# Patient Record
Sex: Male | Born: 1980 | Race: Black or African American | Hispanic: No | Marital: Single | Smoking: Current every day smoker
Health system: Southern US, Community
[De-identification: ages and names within clinical notes are randomized; demographics above are authoritative.]

## PROBLEM LIST (undated history)

## (undated) HISTORY — PX: MANDIBLE FRACTURE SURGERY: SHX706

---

## 2005-09-03 ENCOUNTER — Ambulatory Visit (HOSPITAL_COMMUNITY): Admission: RE | Admit: 2005-09-03 | Discharge: 2005-09-03 | Payer: Self-pay | Admitting: Family Medicine

## 2005-09-03 ENCOUNTER — Emergency Department (HOSPITAL_COMMUNITY): Admission: EM | Admit: 2005-09-03 | Discharge: 2005-09-03 | Payer: Self-pay | Admitting: Family Medicine

## 2005-09-04 ENCOUNTER — Observation Stay (HOSPITAL_COMMUNITY): Admission: EM | Admit: 2005-09-04 | Discharge: 2005-09-05 | Payer: Self-pay | Admitting: Emergency Medicine

## 2005-09-12 ENCOUNTER — Ambulatory Visit (HOSPITAL_COMMUNITY): Admission: RE | Admit: 2005-09-12 | Discharge: 2005-09-12 | Payer: Self-pay | Admitting: Otolaryngology

## 2005-09-16 ENCOUNTER — Ambulatory Visit (HOSPITAL_COMMUNITY): Admission: RE | Admit: 2005-09-16 | Discharge: 2005-09-16 | Payer: Self-pay | Admitting: Otolaryngology

## 2005-09-27 ENCOUNTER — Ambulatory Visit (HOSPITAL_COMMUNITY): Admission: RE | Admit: 2005-09-27 | Discharge: 2005-09-27 | Payer: Self-pay | Admitting: Otolaryngology

## 2005-10-01 ENCOUNTER — Ambulatory Visit (HOSPITAL_COMMUNITY): Admission: RE | Admit: 2005-10-01 | Discharge: 2005-10-01 | Payer: Self-pay | Admitting: Otolaryngology

## 2005-11-08 ENCOUNTER — Ambulatory Visit (HOSPITAL_COMMUNITY): Admission: RE | Admit: 2005-11-08 | Discharge: 2005-11-08 | Payer: Self-pay | Admitting: Otolaryngology

## 2007-07-12 IMAGING — DX DG ORTHOPANTOGRAM /PANORAMIC
1 series · 1 of 1 positions shown · non-contrast
Comparison: none

CLINICAL DATA: Mandible fracture.  Post-op evaluation.
PANOREX ? 1 VIEW:

[view not recorded]
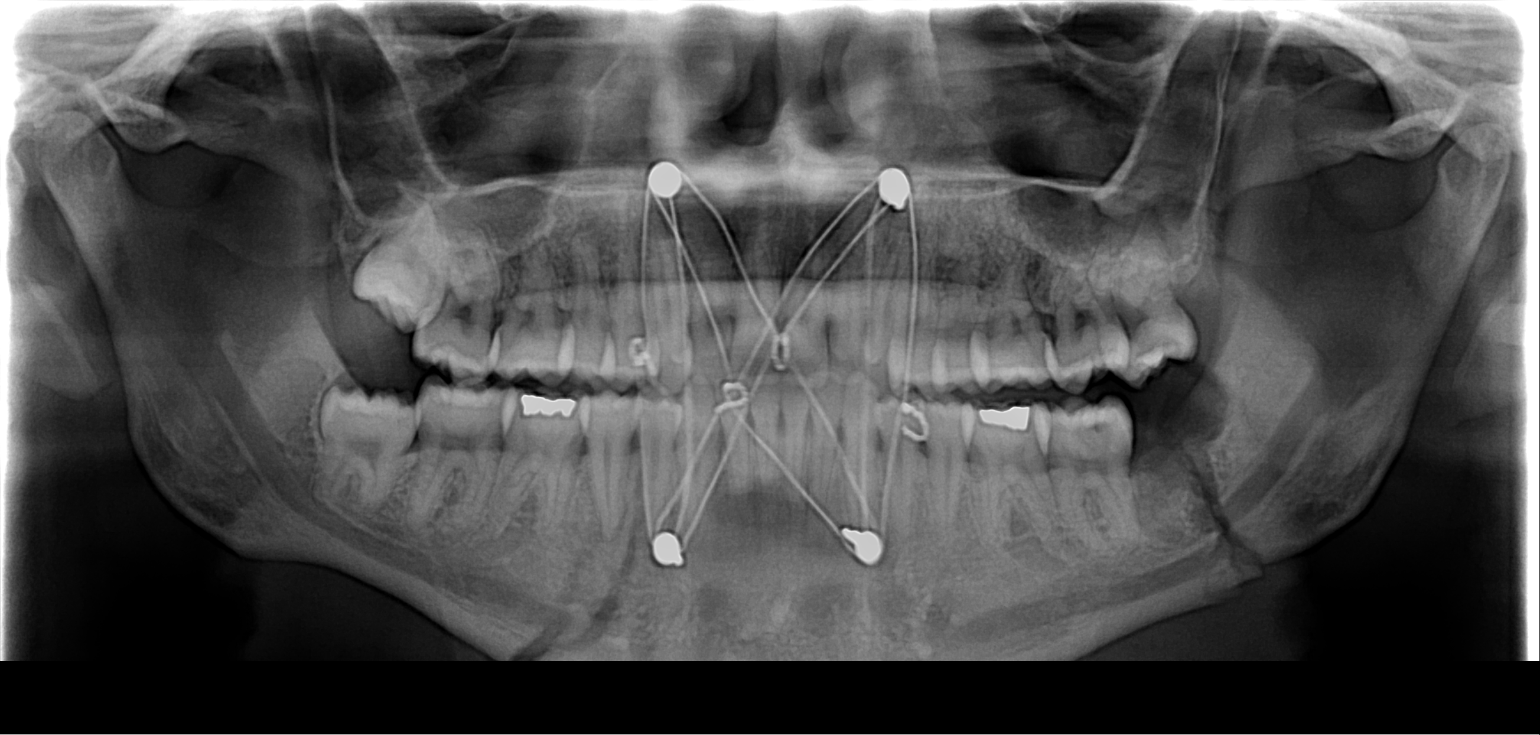

[1 of 1 positions shown; findings below may reference images not displayed]

FINDINGS: The patient has undergone a wiring procedure for stabilization of fracture of the right side of the mandible at the junction of the body and symphysis and the left side of the mandible at the junction of the body and the angle.  There are two maxillary and mandibular screws with wires in place for stabilization.  The patient has had extraction of teeth #16 and #17.  The mandibular canal on the left appears slightly offset by about the width of the canal.
IMPRESSION: Post-wiring procedure with full description as above.

## 2010-12-31 ENCOUNTER — Emergency Department (HOSPITAL_COMMUNITY)
Admission: EM | Admit: 2010-12-31 | Discharge: 2010-12-31 | Disposition: A | Discharge status: Home / Self Care | Payer: Self-pay | Attending: Emergency Medicine | Admitting: Emergency Medicine

## 2010-12-31 DIAGNOSIS — L723 Sebaceous cyst: Secondary | ICD-10-CM | POA: Insufficient documentation

## 2010-12-31 DIAGNOSIS — R221 Localized swelling, mass and lump, neck: Secondary | ICD-10-CM | POA: Insufficient documentation

## 2010-12-31 DIAGNOSIS — R220 Localized swelling, mass and lump, head: Secondary | ICD-10-CM | POA: Insufficient documentation

## 2011-04-09 ENCOUNTER — Emergency Department (HOSPITAL_COMMUNITY)
Admission: EM | Admit: 2011-04-09 | Discharge: 2011-04-09 | Disposition: A | Discharge status: Home / Self Care | Payer: Self-pay | Source: Home / Self Care

## 2011-04-09 ENCOUNTER — Encounter: Payer: Self-pay | Admitting: Emergency Medicine

## 2011-04-09 DIAGNOSIS — L723 Sebaceous cyst: Secondary | ICD-10-CM

## 2011-04-09 NOTE — ED Notes (Signed)
Large cyst to left side of face, in front of left ear lobe, feels fullness in ear lobe.  Has squeezed ear lobe with smelly drainage per patient.  Onset one year ago, but has escalated over the time.

## 2011-04-09 NOTE — ED Provider Notes (Signed)
History     CSN: [id number] Arrival date & time: 04/09/2011 11:23 AM   None     Chief Complaint  Patient presents with  . Cyst    (Consider location/radiation/quality/duration/timing/severity/associated sxs/prior treatment) HPI Comments: Pt presents with c/o a lump in front of his left ear. He states this lump has been present for over a year and is not changing in size. Sometimes in the mornings he is able to squeeze a foul smelling drainage from it. He states he was seen in the ED for this approx one month ago and was advised to follow up with [place] [facility]. Upon review of his last ED visit 12-31-10 pt stated the lump had been present for 3 years and he was advised to follow up with dermatology for removal. Pt states he did not schedule an appt with derm as advised. He has tried Amoxicillin which he had at home hoping it would help without improvement.   The history is provided by the patient.    History reviewed. No pertinent past medical history.  Past Surgical History  Procedure Date  . Mandible fracture surgery alleged assalt, hardware present from jaw surgery    History reviewed. No pertinent family history.  History  Substance Use Topics  . Smoking status: Current Everyday Smoker  . Smokeless tobacco: Not on file  . Alcohol Use: Yes      Review of Systems  Constitutional: Negative for fever and chills.  HENT: Negative for ear pain, congestion and ear discharge.   Respiratory: Positive for cough. Negative for shortness of breath.     Allergies  Review of patient's allergies indicates no known allergies.  Home Medications  No current outpatient prescriptions on file.  BP 112/60  Pulse 88  Temp(Src) 98.2 F (36.8 C) (Oral)  Resp 18  SpO2 100%  Physical Exam  Nursing note and vitals reviewed. Constitutional: He appears well-developed and well-nourished. No distress.  HENT:  Head: Normocephalic and atraumatic.  Right Ear: Tympanic membrane, external  ear and ear canal normal.  Left Ear: Tympanic membrane, external ear and ear canal normal.  Nose: Nose normal.  Mouth/Throat: Uvula is midline, oropharynx is clear and moist and mucous membranes are normal. No oropharyngeal exudate, posterior oropharyngeal edema or posterior oropharyngeal erythema.  Neck: Neck supple.  Cardiovascular: Normal rate, regular rhythm and normal heart sounds.   Pulmonary/Chest: Effort normal and breath sounds normal. No respiratory distress.  Lymphadenopathy:       Head (right side): No submandibular, no tonsillar, no preauricular, no posterior auricular and no occipital adenopathy present.       Head (left side): No submandibular, no tonsillar, no preauricular, no posterior auricular and no occipital adenopathy present.    He has no cervical adenopathy.  Neurological: He is alert.  Skin: Skin is warm and dry.       Anterior to Lt ear 3 cm smooth mobile superficial nodule, typical feel of sebacous cyst. No obvious pore noted. No erythema, and nontender.   Psychiatric: He has a normal mood and affect.    ED Course  Procedures (including critical care time)  Labs Reviewed - No data to display No results found.   1. Sebaceous cyst       MDM  Discussed with pt proper mgmt is excision of cyst, not I&D. Referred pt again to dermatology.         Melody Comas, PA 04/09/11 1210  Melody Comas, [place] 04/09/11 [phone]

## 2011-04-10 NOTE — ED Provider Notes (Signed)
Medical screening examination/treatment/procedure(s) were performed by non-physician practitioner and as supervising physician I was immediately available for consultation/collaboration.   [facility]; MD   Sharin Grave, MD 04/10/11 1012

## 2013-10-21 ENCOUNTER — Encounter (HOSPITAL_COMMUNITY): Payer: Self-pay | Admitting: Emergency Medicine

## 2013-10-21 ENCOUNTER — Emergency Department (INDEPENDENT_AMBULATORY_CARE_PROVIDER_SITE_OTHER)
Admission: EM | Admit: 2013-10-21 | Discharge: 2013-10-21 | Disposition: A | Discharge status: Home / Self Care | Payer: Self-pay | Source: Home / Self Care

## 2013-10-21 DIAGNOSIS — W57XXXA Bitten or stung by nonvenomous insect and other nonvenomous arthropods, initial encounter: Secondary | ICD-10-CM

## 2013-10-21 DIAGNOSIS — T148 Other injury of unspecified body region: Secondary | ICD-10-CM

## 2013-10-21 MED ORDER — CEPHALEXIN 500 MG PO CAPS: 500.0000 mg | ORAL_CAPSULE | Freq: Four times a day (QID) | ORAL | Status: AC

## 2013-10-21 NOTE — Discharge Instructions (Signed)

## 2013-10-21 NOTE — ED Notes (Addendum)
C/o insect bite under eye which was noticed two weeks ago States has seen greenish yellowish pus coming from area States area started off small Hot compresses and home remedies was used as tx

## 2013-10-21 NOTE — ED Provider Notes (Signed)
CSN: [id number]     Arrival date & time 10/21/13  0906 History   First MD Initiated Contact with Patient 10/21/13 [phone]     Chief Complaint  Patient presents with  . Insect Bite   (Consider location/radiation/quality/duration/timing/severity/associated sxs/prior Treatment) HPI Comments: Pt knocked an insect off of his face a few days ago. Afterwards felt tenderness beneath the OD, distal to the lower lid. Swelling area increased to involve the lower lid for brief time. Has been applying warm compresses and now decreased in size to an ovoid vesicle. Denies systemic sx's.   History reviewed. No pertinent past medical history. Past Surgical History  Procedure Laterality Date  . Mandible fracture surgery  alleged assalt, hardware present from jaw surgery   History reviewed. No pertinent family history. History  Substance Use Topics  . Smoking status: Current Every Day Smoker  . Smokeless tobacco: Not on file  . Alcohol Use: Yes    Review of Systems  Constitutional: Negative for fever, activity change and appetite change.  HENT: Negative.   Respiratory: Negative.   Skin:       As per history of present illness  All other systems reviewed and are negative.   Allergies  Review of patient's allergies indicates no known allergies.  Home Medications   Prior to Admission medications   Not on File   There were no vitals taken for this visit. Physical Exam  Nursing note and vitals reviewed. Constitutional: He is oriented to person, place, and time. He appears well-developed and well-nourished. No distress.  HENT:  Mouth/Throat: Oropharynx is clear and moist.  1 point centimeter ovoid vesicular lesion located beneath the right eyelid. It is not extending into the eyelid. It is well marginated. No surrounding erythema or induration. No lymphangitis. The right is unaffected. It is soft but not currently draining.  Eyes: Conjunctivae and EOM are normal. Pupils are equal, round, and  reactive to light.  Neck: Normal range of motion. Neck supple.  Pulmonary/Chest: Effort normal. No respiratory distress.  Lymphadenopathy:    He has no cervical adenopathy.  Neurological: He is alert and oriented to person, place, and time.  Skin: Skin is warm and dry.    ED Course  Procedures (including critical care time) Labs Review Labs Reviewed - No data to display  Imaging Review No results found.   MDM  No diagnosis found. Warm compresses Keflex rechk for problems.   Hayden Rasmussenavid Evaristo Tsuda, NP 10/21/13 [phone]

## 2013-10-22 NOTE — ED Provider Notes (Signed)
Medical screening examination/treatment/procedure(s) were performed by non-physician practitioner and as supervising physician I was immediately available for consultation/collaboration.  Jordell Outten, M.D.  Teniola Tseng C Saramarie Stinger, MD 10/22/13 1748 

## 2016-10-24 DIAGNOSIS — Y929 Unspecified place or not applicable: Secondary | ICD-10-CM | POA: Insufficient documentation

## 2016-10-24 DIAGNOSIS — Y999 Unspecified external cause status: Secondary | ICD-10-CM | POA: Insufficient documentation

## 2016-10-24 DIAGNOSIS — Y939 Activity, unspecified: Secondary | ICD-10-CM | POA: Insufficient documentation

## 2016-10-24 DIAGNOSIS — Z5321 Procedure and treatment not carried out due to patient leaving prior to being seen by health care provider: Secondary | ICD-10-CM | POA: Insufficient documentation

## 2016-10-24 DIAGNOSIS — W57XXXA Bitten or stung by nonvenomous insect and other nonvenomous arthropods, initial encounter: Secondary | ICD-10-CM | POA: Insufficient documentation

## 2016-10-25 ENCOUNTER — Emergency Department (HOSPITAL_COMMUNITY)
Admission: EM | Admit: 2016-10-25 | Discharge: 2016-10-25 | Disposition: A | Discharge status: Home / Self Care | Payer: Self-pay | Attending: Emergency Medicine | Admitting: Emergency Medicine

## 2016-10-25 ENCOUNTER — Encounter (HOSPITAL_COMMUNITY): Payer: Self-pay | Admitting: Emergency Medicine

## 2016-10-25 NOTE — ED Triage Notes (Signed)
Patient reports insect bite at left distal forearm with itching this evening .

## 2018-02-19 ENCOUNTER — Encounter (HOSPITAL_COMMUNITY): Payer: Self-pay

## 2018-02-19 ENCOUNTER — Other Ambulatory Visit: Payer: Self-pay

## 2018-02-19 ENCOUNTER — Ambulatory Visit (HOSPITAL_COMMUNITY)
Admission: EM | Admit: 2018-02-19 | Discharge: 2018-02-19 | Disposition: A | Discharge status: Home / Self Care | Payer: Self-pay | Attending: Family Medicine | Admitting: Family Medicine

## 2018-02-19 DIAGNOSIS — J020 Streptococcal pharyngitis: Secondary | ICD-10-CM

## 2018-02-19 LAB — POCT RAPID STREP A: Streptococcus, Group A Screen (Direct): POSITIVE — AB

## 2018-02-19 MED ORDER — CETIRIZINE HCL 10 MG PO CAPS: 10.0000 mg | ORAL_CAPSULE | Freq: Every day | ORAL | 0 refills | Status: AC

## 2018-02-19 MED ORDER — AMOXICILLIN-POT CLAVULANATE 875-125 MG PO TABS: 1.0000 | ORAL_TABLET | Freq: Two times a day (BID) | ORAL | 0 refills | Status: AC

## 2018-02-19 MED ORDER — PSEUDOEPH-BROMPHEN-DM 30-2-10 MG/5ML PO SYRP: 5.0000 mL | ORAL_SOLUTION | Freq: Four times a day (QID) | ORAL | 0 refills | Status: AC | PRN

## 2018-02-19 NOTE — ED Triage Notes (Signed)
Pt c/o sore throat x 1 week

## 2018-02-19 NOTE — Discharge Instructions (Signed)

## 2018-02-19 NOTE — ED Provider Notes (Signed)
[facility]    CSN: [id number] Arrival date & time: 02/19/18  1427     History   Chief Complaint Chief Complaint  Patient presents with  . Sore Throat    HPI Carl Wang is a 37 y.o. male no significant past medical history presenting today for evaluation of sore throat.  He states that he has had a sore throat for approximately 1 week.  Is also noticed a significant amount of mucus production.  He is having a productive cough.  Slight decrease in appetite due to discomfort with swallowing.  Mild headache.  Has tried Robitussin and Advil without relief.  Denies fevers.  Denies known exposures to strep.  Patient smokes approximately 7 cigarettes a day.  HPI  History reviewed. No pertinent past medical history.  There are no active problems to display for this patient.   Past Surgical History:  Procedure Laterality Date  . MANDIBLE FRACTURE SURGERY  alleged assalt, hardware present from jaw surgery       Home Medications    Prior to Admission medications   Medication Sig Start Date End Date Taking? Authorizing Provider  amoxicillin-clavulanate (AUGMENTIN) 875-125 MG tablet Take 1 tablet by mouth every 12 (twelve) hours for 10 days. 02/19/18 03/01/18  Wieters, Hallie C, PA-C  brompheniramine-pseudoephedrine-DM 30-2-10 MG/5ML syrup Take 5 mLs by mouth 4 (four) times daily as needed. 02/19/18   Wieters, Hallie C, PA-C  cephALEXin (KEFLEX) 500 MG capsule Take 1 capsule (500 mg total) by mouth 4 (four) times daily. 10/21/13   Hayden Rasmussen, NP  Cetirizine HCl 10 MG CAPS Take 1 capsule (10 mg total) by mouth daily for 10 days. 02/19/18 03/01/18  Wieters, Junius Creamer, PA-C    Family History History reviewed. No pertinent family history.  Social History Social History   Tobacco Use  . Smoking status: Current Every Day Smoker  . Smokeless tobacco: Never Used  Substance Use Topics  . Alcohol use: Yes  . Drug use: No     Allergies   Patient has no known  allergies.   Review of Systems Review of Systems  Constitutional: Negative for activity change, appetite change, chills, fatigue and fever.  HENT: Positive for congestion and sore throat. Negative for ear pain, rhinorrhea, sinus pressure and trouble swallowing.   Eyes: Negative for discharge and redness.  Respiratory: Positive for cough. Negative for chest tightness and shortness of breath.   Cardiovascular: Negative for chest pain.  Gastrointestinal: Negative for abdominal pain, diarrhea, nausea and vomiting.  Musculoskeletal: Negative for myalgias.  Skin: Negative for rash.  Neurological: Negative for dizziness, light-headedness and headaches.     Physical Exam Triage Vital Signs ED Triage Vitals  Enc Vitals Group     BP 02/19/18 1447 138/78     Pulse Rate 02/19/18 1447 100     Resp 02/19/18 1447 18     Temp 02/19/18 1447 99.4 F (37.4 C)     Temp Source 02/19/18 1447 Oral     SpO2 02/19/18 1447 100 %     Weight 02/19/18 1448 200 lb (90.7 kg)     Height --      Head Circumference --      Peak Flow --      Pain Score 02/19/18 1449 6     Pain Loc --      Pain Edu? --      Excl. in [place]? --    No data found.  Updated Vital Signs BP 138/78 (BP Location: Right Arm)  Pulse 100   Temp 99.4 F (37.4 C) (Oral)   Resp 18   Wt 200 lb (90.7 kg)   SpO2 100%   Visual Acuity Right Eye Distance:   Left Eye Distance:   Bilateral Distance:    Right Eye Near:   Left Eye Near:    Bilateral Near:     Physical Exam  Constitutional: He appears well-developed and well-nourished.  HENT:  Head: Normocephalic and atraumatic.  Bilateral ears without tenderness to palpation of external auricle, tragus and mastoid, EAC's without erythema or swelling, TM's with good bony landmarks and cone of light. Non erythematous.  Oral mucosa pink and moist, bilateral tonsils mildly enlarged and erythematous, small amount of exudate, uvula slightly swollen, no deviation.  Posterior pharynx  erythematous. Normal phonation.   Eyes: Conjunctivae are normal.  Neck: Neck supple.  Cardiovascular: Normal rate and regular rhythm.  No murmur heard. Pulmonary/Chest: Effort normal and breath sounds normal. No respiratory distress.  Breathing comfortably at rest, CTABL, no wheezing, rales or other adventitious sounds auscultated  Abdominal: Soft. There is no tenderness.  Musculoskeletal: He exhibits no edema.  Neurological: He is alert.  Skin: Skin is warm and dry.  Psychiatric: He has a normal mood and affect.  Nursing note and vitals reviewed.    UC Treatments / Results  Labs (all labs ordered are listed, but only abnormal results are displayed) Labs Reviewed - No data to display  EKG None  Radiology No results found.  Procedures Procedures (including critical care time)  Medications Ordered in UC Medications - No data to display  Initial Impression / Assessment and Plan / UC Course  I have reviewed the triage vital signs and the nursing notes.  Pertinent labs & imaging results that were available during my care of the patient were reviewed by me and considered in my medical decision making (see chart for details).     Patient tested positive for strep. No evidence of peritonsillar abscess or retropharyngeal abscess. Patient is nontoxic appearing, no drooling, dysphagia, muffled voice, or tripoding. No trismus.  Augmentin provided.  Also provided Zyrtec and cough syrup to help with drainage and congestion/cough.Discussed strict return precautions. Patient verbalized understanding and is agreeable with plan.   Final Clinical Impressions(s) / UC Diagnoses   Final diagnoses:  Strep throat     Discharge Instructions     Sore Throat  Your rapid strep test was positive today. We will treat you for strep throat with an antibiotic. Please take Amoxicillin as prescribed.   Please continue Tylenol or Ibuprofen for fever and pain. May try salt water gargles, cepacol  lozenges, throat spray, or OTC cold relief medicine for throat discomfort. If you also have congestion take a daily anti-histamine like Zyrtec, Claritin, and a oral decongestant to help with post nasal drip that may be irritating your throat.   Stay hydrated and drink plenty of fluids to keep your throat coated relieve irritation.     ED Prescriptions    Medication Sig Dispense Auth. Provider   amoxicillin-clavulanate (AUGMENTIN) 875-125 MG tablet Take 1 tablet by mouth every 12 (twelve) hours for 10 days. 20 tablet Wieters, Hallie C, PA-C   Cetirizine HCl 10 MG CAPS Take 1 capsule (10 mg total) by mouth daily for 10 days. 10 capsule Wieters, Hallie C, PA-C   brompheniramine-pseudoephedrine-DM 30-2-10 MG/5ML syrup Take 5 mLs by mouth 4 (four) times daily as needed. 120 mL Wieters, [place] C, PA-C     Controlled Substance Prescriptions [place] Controlled  Substance Registry consulted? Not Applicable   Lew Dawes, [place] 02/19/18 1519

## 2018-12-20 ENCOUNTER — Other Ambulatory Visit: Payer: Self-pay

## 2018-12-20 ENCOUNTER — Encounter (HOSPITAL_COMMUNITY): Payer: Self-pay | Admitting: Emergency Medicine

## 2018-12-20 ENCOUNTER — Ambulatory Visit (INDEPENDENT_AMBULATORY_CARE_PROVIDER_SITE_OTHER): Payer: Self-pay

## 2018-12-20 ENCOUNTER — Ambulatory Visit (HOSPITAL_COMMUNITY)
Admission: EM | Admit: 2018-12-20 | Discharge: 2018-12-20 | Disposition: A | Discharge status: Home / Self Care | Payer: Self-pay | Attending: Family Medicine | Admitting: Family Medicine

## 2018-12-20 DIAGNOSIS — R1084 Generalized abdominal pain: Secondary | ICD-10-CM

## 2018-12-20 LAB — POCT URINALYSIS DIP (DEVICE)
Bilirubin Urine: NEGATIVE
Glucose, UA: NEGATIVE mg/dL
Hgb urine dipstick: NEGATIVE
Ketones, ur: NEGATIVE mg/dL
Leukocytes,Ua: NEGATIVE
Nitrite: NEGATIVE
Protein, ur: NEGATIVE mg/dL
Specific Gravity, Urine: 1.01 (ref 1.005–1.030)
Urobilinogen, UA: 0.2 mg/dL (ref 0.0–1.0)
pH: 6 (ref 5.0–8.0)

## 2018-12-20 NOTE — ED Triage Notes (Signed)
Pt sts nausea and upset stomach since eating some possibly bad food on Monday

## 2018-12-20 NOTE — ED Provider Notes (Signed)
[name]    CSN: [id number] Arrival date & time: 12/20/18  1415      History   Chief Complaint Chief Complaint  Patient presents with  . Nausea    HPI Carl Wang is a 38 y.o. male.   Patient is a 38 year old male that is otherwise healthy presenting today for nausea, generalized abdominal cramping that has been waxing and waning since Monday.  Reporting that he has not had a good bowel movement since Monday and he typically goes to the bathroom every morning.  This started after eating some possible "bad meat".  No vomiting or diarrhea.  No rectal bleeding.  He has not taken any medication for his symptoms.  Denies any fevers or recent traveling.  ROS per HPI      History reviewed. No pertinent past medical history.  There are no active problems to display for this patient.   Past Surgical History:  Procedure Laterality Date  . MANDIBLE FRACTURE SURGERY  alleged assalt, hardware present from jaw surgery       Home Medications    Prior to Admission medications   Medication Sig Start Date End Date Taking? Authorizing Provider  brompheniramine-pseudoephedrine-DM 30-2-10 MG/5ML syrup Take 5 mLs by mouth 4 (four) times daily as needed. 02/19/18   Wieters, Hallie C, PA-C  cephALEXin (KEFLEX) 500 MG capsule Take 1 capsule (500 mg total) by mouth 4 (four) times daily. Patient not taking: Reported on 12/20/2018 10/21/13   Janne Napoleon, NP  Cetirizine HCl 10 MG CAPS Take 1 capsule (10 mg total) by mouth daily for 10 days. 02/19/18 03/01/18  Wieters, Elesa Hacker, PA-C    Family History History reviewed. No pertinent family history.  Social History Social History   Tobacco Use  . Smoking status: Current Every Day Smoker  . Smokeless tobacco: Never Used  Substance Use Topics  . Alcohol use: Yes  . Drug use: No     Allergies   Patient has no known allergies.   Review of Systems Review of Systems   Physical Exam Triage Vital Signs ED Triage Vitals   Enc Vitals Group     BP 12/20/18 1449 136/83     Pulse Rate 12/20/18 1449 73     Resp 12/20/18 1449 18     Temp 12/20/18 1449 98 F (36.7 C)     Temp Source 12/20/18 1449 Oral     SpO2 12/20/18 1449 99 %     Weight --      Height --      Head Circumference --      Peak Flow --      Pain Score 12/20/18 1450 2     Pain Loc --      Pain Edu? --      Excl. in [place]? --    No data found.  Updated Vital Signs BP 136/83 (BP Location: Right Arm)   Pulse 73   Temp 98 F (36.7 C) (Oral)   Resp 18   SpO2 99%   Visual Acuity Right Eye Distance:   Left Eye Distance:   Bilateral Distance:    Right Eye Near:   Left Eye Near:    Bilateral Near:     Physical Exam Vitals signs and nursing note reviewed.  Constitutional:      Appearance: Normal appearance.  HENT:     Head: Normocephalic and atraumatic.     Nose: Nose normal.  Eyes:     Conjunctiva/sclera: Conjunctivae normal.  Neck:  Musculoskeletal: Normal range of motion.  Pulmonary:     Effort: Pulmonary effort is normal.  Abdominal:     General: There is no distension.     Palpations: Abdomen is soft.     Tenderness: There is no abdominal tenderness. There is no right CVA tenderness, left CVA tenderness, guarding or rebound.     Comments: Generalized abdominal discomfort More in the right upper quadrant and epigastric area.  Musculoskeletal: Normal range of motion.  Skin:    General: Skin is warm and dry.  Neurological:     Mental Status: He is alert.  Psychiatric:        Mood and Affect: Mood normal.      UC Treatments / Results  Labs (all labs ordered are listed, but only abnormal results are displayed) Labs Reviewed  POCT URINALYSIS DIP (DEVICE)    EKG   Radiology Dg Abd 1 View  Result Date: 12/20/2018 CLINICAL DATA:  Abdominal pain rule out constipation EXAM: ABDOMEN - 1 VIEW COMPARISON:  None. FINDINGS: Mild amount of stool in the hepatic flexure of the colon. Normal bowel gas pattern without  obstruction or ileus. No abnormal calcifications. IMPRESSION: Mild amount of stool in the [facility] flexure. Electronically Signed   By: Marlan Palauharles  Clark M.D.   On: 12/20/2018 15:59    Procedures Procedures (including critical care time)  Medications Ordered in UC Medications - No data to display  Initial Impression / Assessment and Plan / UC Course  I have reviewed the triage vital signs and the nursing notes.  Pertinent labs & imaging results that were available during my care of the patient were reviewed by me and considered in my medical decision making (see chart for details).     Symptoms most likely due to constipation or some sort of viral illness. No concern for obstruction We will have him try a small dose of MiraLAX, increase water and fruits and veggies see if this helps. Otherwise if the symptoms worsen to include more severe abdominal pain, severe nausea or vomiting he will need to go the ER. Patient understanding and agree. Urine negative for any infection Final Clinical Impressions(s) / UC Diagnoses   Final diagnoses:  Generalized abdominal pain     Discharge Instructions     I believe that your abdominal pain is either from effects from the meat that you ate or some mild constipation. You could monitor your symptoms, make sure you are drinking plenty of fluids Try using some MiraLAX over-the-counter for constipation. For any continued or worsening symptoms to include more severe abdominal pain, nausea or vomiting you will need to go the hospital.    ED Prescriptions    None     Controlled Substance Prescriptions [place] Controlled Substance Registry consulted? Not Applicable   Janace ArisBast, Lijah Bourque A, NP 12/21/18 1421

## 2018-12-20 NOTE — Discharge Instructions (Addendum)
I believe that your abdominal pain is either from effects from the meat that you ate or some mild constipation. You could monitor your symptoms, make sure you are drinking plenty of fluids Try using some MiraLAX over-the-counter for constipation. For any continued or worsening symptoms to include more severe abdominal pain, nausea or vomiting you will need to go the hospital.

## 2019-02-01 ENCOUNTER — Other Ambulatory Visit: Payer: Self-pay

## 2019-02-01 DIAGNOSIS — Z20822 Contact with and (suspected) exposure to covid-19: Secondary | ICD-10-CM

## 2019-02-02 LAB — NOVEL CORONAVIRUS, NAA: SARS-CoV-2, NAA: DETECTED — AB

## 2019-02-04 ENCOUNTER — Telehealth: Payer: Self-pay

## 2019-02-04 NOTE — Telephone Encounter (Signed)
Provided Covid lab results, along with care advice.  Patient voiced understanding.

## 2019-05-13 ENCOUNTER — Other Ambulatory Visit: Payer: Self-pay

## 2019-05-13 ENCOUNTER — Encounter (HOSPITAL_COMMUNITY): Payer: Self-pay

## 2019-05-13 ENCOUNTER — Ambulatory Visit (INDEPENDENT_AMBULATORY_CARE_PROVIDER_SITE_OTHER): Payer: Self-pay

## 2019-05-13 ENCOUNTER — Ambulatory Visit (HOSPITAL_COMMUNITY)
Admission: EM | Admit: 2019-05-13 | Discharge: 2019-05-13 | Disposition: A | Discharge status: Home / Self Care | Payer: Self-pay | Attending: Family Medicine | Admitting: Family Medicine

## 2019-05-13 DIAGNOSIS — W208XXA Other cause of strike by thrown, projected or falling object, initial encounter: Secondary | ICD-10-CM

## 2019-05-13 DIAGNOSIS — S92332A Displaced fracture of third metatarsal bone, left foot, initial encounter for closed fracture: Secondary | ICD-10-CM

## 2019-05-13 DIAGNOSIS — S92325A Nondisplaced fracture of second metatarsal bone, left foot, initial encounter for closed fracture: Secondary | ICD-10-CM

## 2019-05-13 MED ORDER — IBUPROFEN 800 MG PO TABS: 800.0000 mg | ORAL_TABLET | Freq: Three times a day (TID) | ORAL | 0 refills | Status: AC

## 2019-05-13 MED ORDER — TRAMADOL HCL 50 MG PO TABS
50.0000 mg | ORAL_TABLET | Freq: Four times a day (QID) | ORAL | 0 refills | Status: AC | PRN
Start: 2019-05-13 — End: ?

## 2019-05-13 NOTE — Discharge Instructions (Addendum)
IMPRESSION: 1. Mildly displaced transverse fracture of the distal metaphysis of the left third metatarsal.   2. Nondisplaced oblique fracture of the distal metaphysis of the left second metatarsal.     Weight bearing as tolerated.  Ace wrap for compression, use of stiff soled shoe as provided.  Ibuprofen every 8 hours, take with food.  Tramadol for breakthrough pain. May cause drowsiness. Please do not take if driving or drinking alcohol.   Follow up with orthopedics for definitive treatment plan.

## 2019-05-13 NOTE — ED Provider Notes (Signed)
[facility]    CSN: [id number] Arrival date & time: 05/13/19  1036      History   Chief Complaint Chief Complaint  Patient presents with  . Foot Pain    Left    HPI Carl Wang is a 39 y.o. male.   Carl Wang presents with complaints of left foot pain. He dropped the corner of the tv on his foot on 1/16. Pain and swelling since. Denies any previous injury to the foot. Unable to bear weight. No numbness or tingling to his toes but does feel numbness sensation to dorsal foot at area of impact. Hasn't taken any medications for pain, pain is 7/10.    ROS per HPI, negative if not otherwise mentioned.      History reviewed. No pertinent past medical history.  There are no problems to display for this patient.   Past Surgical History:  Procedure Laterality Date  . MANDIBLE FRACTURE SURGERY  alleged assalt, hardware present from jaw surgery       Home Medications    Prior to Admission medications   Medication Sig Start Date End Date Taking? Authorizing Provider  brompheniramine-pseudoephedrine-DM 30-2-10 MG/5ML syrup Take 5 mLs by mouth 4 (four) times daily as needed. 02/19/18   Wieters, Hallie C, PA-C  cephALEXin (KEFLEX) 500 MG capsule Take 1 capsule (500 mg total) by mouth 4 (four) times daily. Patient not taking: Reported on 12/20/2018 10/21/13   Hayden Rasmussen, NP  Cetirizine HCl 10 MG CAPS Take 1 capsule (10 mg total) by mouth daily for 10 days. 02/19/18 03/01/18  Wieters, Hallie C, PA-C  ibuprofen (ADVIL) 800 MG tablet Take 1 tablet (800 mg total) by mouth 3 (three) times daily. 05/13/19   Georgetta Haber, NP  traMADol (ULTRAM) 50 MG tablet Take 1 tablet (50 mg total) by mouth every 6 (six) hours as needed. 05/13/19   Georgetta Haber, NP    Family History Family History  Family history unknown: Yes    Social History Social History   Tobacco Use  . Smoking status: Current Every Day Smoker  . Smokeless tobacco: Never Used  Substance Use Topics  .  Alcohol use: Yes  . Drug use: No     Allergies   Patient has no known allergies.   Review of Systems Review of Systems   Physical Exam Triage Vital Signs ED Triage Vitals  Enc Vitals Group     BP 05/13/19 1136 136/71     Pulse Rate 05/13/19 1136 87     Resp 05/13/19 1136 18     Temp 05/13/19 1136 98.5 F (36.9 C)     Temp Source 05/13/19 1136 Oral     SpO2 05/13/19 1136 99 %     Weight --      Height --      Head Circumference --      Peak Flow --      Pain Score 05/13/19 1135 8     Pain Loc --      Pain Edu? --      Excl. in GC? --    No data found.  Updated Vital Signs BP 136/71 (BP Location: Right Arm)   Pulse 87   Temp 98.5 F (36.9 C) (Oral)   Resp 18   SpO2 99%   Visual Acuity Right Eye Distance:   Left Eye Distance:   Bilateral Distance:    Right Eye Near:   Left Eye Near:    Bilateral Near:  Physical Exam Constitutional:      Appearance: He is well-developed.  Cardiovascular:     Rate and Rhythm: Normal rate.  Pulmonary:     Effort: Pulmonary effort is normal.  Musculoskeletal:     Left foot: Swelling, tenderness and bony tenderness present. Normal pulse.     Comments: Left dorsal foot with swelling and pain; cap refill < 2 seconds ; toe sensation intact; no ankle pain and full ROM of ankle; small abrasion to dorsal foot at area of impact from tv  Skin:    General: Skin is warm and dry.  Neurological:     Mental Status: He is alert and oriented to person, place, and time.      UC Treatments / Results  Labs (all labs ordered are listed, but only abnormal results are displayed) Labs Reviewed - No data to display  EKG   Radiology DG Foot Complete Left  Result Date: 05/13/2019 CLINICAL DATA:  Dropped TV on foot, pain EXAM: LEFT FOOT - COMPLETE 3+ VIEW COMPARISON:  None. FINDINGS: There is a mildly displaced transverse fracture of the distal metaphysis of the left third metatarsal. There is an additional nondisplaced oblique  fracture of the distal metaphysis of the left second metatarsal. Joint spaces are well preserved. Soft tissue edema about the forefoot. IMPRESSION: 1. Mildly displaced transverse fracture of the distal metaphysis of the left third metatarsal. 2. Nondisplaced oblique fracture of the distal metaphysis of the left second metatarsal. Electronically Signed   By: Eddie Candle M.D.   On: 05/13/2019 12:14    Procedures Procedures (including critical care time)  Medications Ordered in UC Medications - No data to display  Initial Impression / Assessment and Plan / UC Course  I have reviewed the triage vital signs and the nursing notes.  Pertinent labs & imaging results that were available during my care of the patient were reviewed by me and considered in my medical decision making (see chart for details).     Fractures to second and third metatarsal seen on xray. Ice, elevation, ace wrap, post op shoe, weight bearing as tolerated. Crutches provided today. Follow up with orthopedics for definitive treatment. Patient verbalized understanding and agreeable to plan.   Final Clinical Impressions(s) / UC Diagnoses   Final diagnoses:  Closed displaced fracture of third metatarsal bone of left foot, initial encounter  Closed nondisplaced fracture of second metatarsal bone of left foot, initial encounter     Discharge Instructions     IMPRESSION: 1. Mildly displaced transverse fracture of the distal metaphysis of the left third metatarsal.   2. Nondisplaced oblique fracture of the distal metaphysis of the left second metatarsal.     Weight bearing as tolerated.  Ace wrap for compression, use of stiff soled shoe as provided.  Ibuprofen every 8 hours, take with food.  Tramadol for breakthrough pain. May cause drowsiness. Please do not take if driving or drinking alcohol.   Follow up with orthopedics for definitive treatment plan.     ED Prescriptions    Medication Sig Dispense Auth.  Provider   ibuprofen (ADVIL) 800 MG tablet Take 1 tablet (800 mg total) by mouth 3 (three) times daily. 30 tablet Augusto Gamble B, NP   traMADol (ULTRAM) 50 MG tablet Take 1 tablet (50 mg total) by mouth every 6 (six) hours as needed. 15 tablet Augusto Gamble B, NP     I have reviewed the PDMP during this encounter.   Zigmund Gottron, NP 05/13/19 1245

## 2019-05-13 NOTE — ED Triage Notes (Signed)
Pt presents with left foot injury after he dropped a TV on his foot; pt states he has a lot of pain & swelling after trying to ice and elevate it.

## 2020-08-17 ENCOUNTER — Encounter (HOSPITAL_COMMUNITY): Payer: Self-pay

## 2020-08-17 ENCOUNTER — Ambulatory Visit (HOSPITAL_COMMUNITY)
Admission: EM | Admit: 2020-08-17 | Discharge: 2020-08-17 | Disposition: A | Discharge status: Home / Self Care | Payer: PRIVATE HEALTH INSURANCE | Attending: Emergency Medicine | Admitting: Emergency Medicine

## 2020-08-17 ENCOUNTER — Other Ambulatory Visit: Payer: Self-pay

## 2020-08-17 DIAGNOSIS — L03114 Cellulitis of left upper limb: Secondary | ICD-10-CM | POA: Diagnosis not present

## 2020-08-17 MED ORDER — DOXYCYCLINE HYCLATE 100 MG PO CAPS: 100.0000 mg | ORAL_CAPSULE | Freq: Two times a day (BID) | ORAL | 0 refills | Status: AC

## 2020-08-17 NOTE — ED Provider Notes (Signed)
[facility]    CSN: [id number] Arrival date & time: 08/17/20  1236      History   Chief Complaint Chief Complaint  Patient presents with  . Insect Bite    HPI Carl Wang is a 40 y.o. male.   Patient here for evaluation of left hand swelling.  Reports that he believes he was bitten by some type of insect approximately 2 days ago.  Swelling has developed and worsened over the past 2 days.  Has not taken any OTC medications or treatments.  Reports full ROM in hand and fingers.  Denies any specific alleviating or aggravating factors.  Denies any fevers, chest pain, shortness of breath, N/V/D, numbness, tingling, weakness, abdominal pain, or headaches.   ROS: As per HPI, all other pertinent ROS negative    The history is provided by the patient.    History reviewed. No pertinent past medical history.  There are no problems to display for this patient.   Past Surgical History:  Procedure Laterality Date  . MANDIBLE FRACTURE SURGERY  alleged assalt, hardware present from jaw surgery       Home Medications    Prior to Admission medications   Medication Sig Start Date End Date Taking? Authorizing Provider  doxycycline (VIBRAMYCIN) 100 MG capsule Take 1 capsule (100 mg total) by mouth 2 (two) times daily for 10 days. 08/17/20 08/27/20 Yes Ivette Loyal, NP  brompheniramine-pseudoephedrine-DM 30-2-10 MG/5ML syrup Take 5 mLs by mouth 4 (four) times daily as needed. 02/19/18   Wieters, Hallie C, PA-C  cephALEXin (KEFLEX) 500 MG capsule Take 1 capsule (500 mg total) by mouth 4 (four) times daily. Patient not taking: Reported on 12/20/2018 10/21/13   Hayden Rasmussen, NP  Cetirizine HCl 10 MG CAPS Take 1 capsule (10 mg total) by mouth daily for 10 days. 02/19/18 03/01/18  Wieters, Hallie C, PA-C  ibuprofen (ADVIL) 800 MG tablet Take 1 tablet (800 mg total) by mouth 3 (three) times daily. 05/13/19   Georgetta Haber, NP  traMADol (ULTRAM) 50 MG tablet Take 1 tablet (50 mg total)  by mouth every 6 (six) hours as needed. 05/13/19   Georgetta Haber, NP    Family History Family History  Family history unknown: Yes    Social History Social History   Tobacco Use  . Smoking status: Current Every Day Smoker  . Smokeless tobacco: Never Used  Vaping Use  . Vaping Use: Never used  Substance Use Topics  . Alcohol use: Yes  . Drug use: No     Allergies   Patient has no known allergies.   Review of Systems Review of Systems  Musculoskeletal: Positive for joint swelling.  All other systems reviewed and are negative.    Physical Exam Triage Vital Signs ED Triage Vitals  Enc Vitals Group     BP 08/17/20 1338 109/67     Pulse Rate 08/17/20 1338 75     Resp 08/17/20 1338 18     Temp 08/17/20 1338 98 F (36.7 C)     Temp Source 08/17/20 1338 Oral     SpO2 08/17/20 1338 98 %     Weight --      Height --      Head Circumference --      Peak Flow --      Pain Score 08/17/20 1337 2     Pain Loc --      Pain Edu? --      Excl. in GC? --  No data found.  Updated Vital Signs BP 109/67 (BP Location: Right Arm)   Pulse 75   Temp 98 F (36.7 C) (Oral)   Resp 18   SpO2 98%   Visual Acuity Right Eye Distance:   Left Eye Distance:   Bilateral Distance:    Right Eye Near:   Left Eye Near:    Bilateral Near:     Physical Exam Vitals and nursing note reviewed.  Constitutional:      General: He is not in acute distress.    Appearance: Normal appearance. He is not ill-appearing, toxic-appearing or diaphoretic.  HENT:     Head: Normocephalic and atraumatic.  Eyes:     Conjunctiva/sclera: Conjunctivae normal.  Cardiovascular:     Rate and Rhythm: Normal rate.     Pulses: Normal pulses.  Pulmonary:     Effort: Pulmonary effort is normal.  Abdominal:     General: Abdomen is flat.  Musculoskeletal:        General: Normal range of motion.     Cervical back: Normal range of motion.  Skin:    General: Skin is warm and dry.     Findings:  Erythema (swelling to top of left hand) present.  Neurological:     General: No focal deficit present.     Mental Status: He is alert and oriented to person, place, and time.  Psychiatric:        Mood and Affect: Mood normal.      UC Treatments / Results  Labs (all labs ordered are listed, but only abnormal results are displayed) Labs Reviewed - No data to display  EKG   Radiology No results found.  Procedures Procedures (including critical care time)  Medications Ordered in UC Medications - No data to display  Initial Impression / Assessment and Plan / UC Course  I have reviewed the triage vital signs and the nursing notes.  Pertinent labs & imaging results that were available during my care of the patient were reviewed by me and considered in my medical decision making (see chart for details).    Assessment negative for red flags or concerns.  Likely cellulitis.  Doxy twice daily for the next 10 days.  May apply warm compress or ice for comfort.  Recommend Tylenol and/or ibuprofen as needed for pain and fevers.  Strict ED follow-up for worsening symptoms.   Final Clinical Impressions(s) / UC Diagnoses   Final diagnoses:  Cellulitis of left upper extremity     Discharge Instructions     Take the doxycycline twice a day for the next 10 days.  You can apply a warm compress or ice for comfort.   Take Tylenol and/or ibuprofen as needed for pain.  If the redness and swelling gets worse, you develop a fever, or notice red streaks up your arm go directly to the emergency room.   Return or go to the Emergency Department if symptoms worsen or do not improve in the next few days.      ED Prescriptions    Medication Sig Dispense Auth. Provider   doxycycline (VIBRAMYCIN) 100 MG capsule Take 1 capsule (100 mg total) by mouth 2 (two) times daily for 10 days. 20 capsule Ivette Loyal, NP     PDMP not reviewed this encounter.   Ivette Loyal, NP 08/17/20 1440

## 2020-08-17 NOTE — ED Triage Notes (Signed)
Pt has unknown insect bite to top of left hand that is itchy and has some slight discomfort X 2 days.

## 2020-08-17 NOTE — Discharge Instructions (Signed)
Take the doxycycline twice a day for the next 10 days.  You can apply a warm compress or ice for comfort.   Take Tylenol and/or ibuprofen as needed for pain.  If the redness and swelling gets worse, you develop a fever, or notice red streaks up your arm go directly to the emergency room.   Return or go to the Emergency Department if symptoms worsen or do not improve in the next few days.

## 2021-03-11 IMAGING — DX DG FOOT COMPLETE 3+V*L*
3 series · 3 of 3 positions shown · non-contrast
Comparison: None.

CLINICAL DATA: Dropped TV on foot, pain

EXAM:
LEFT FOOT - COMPLETE 3+ VIEW

[foot ap]
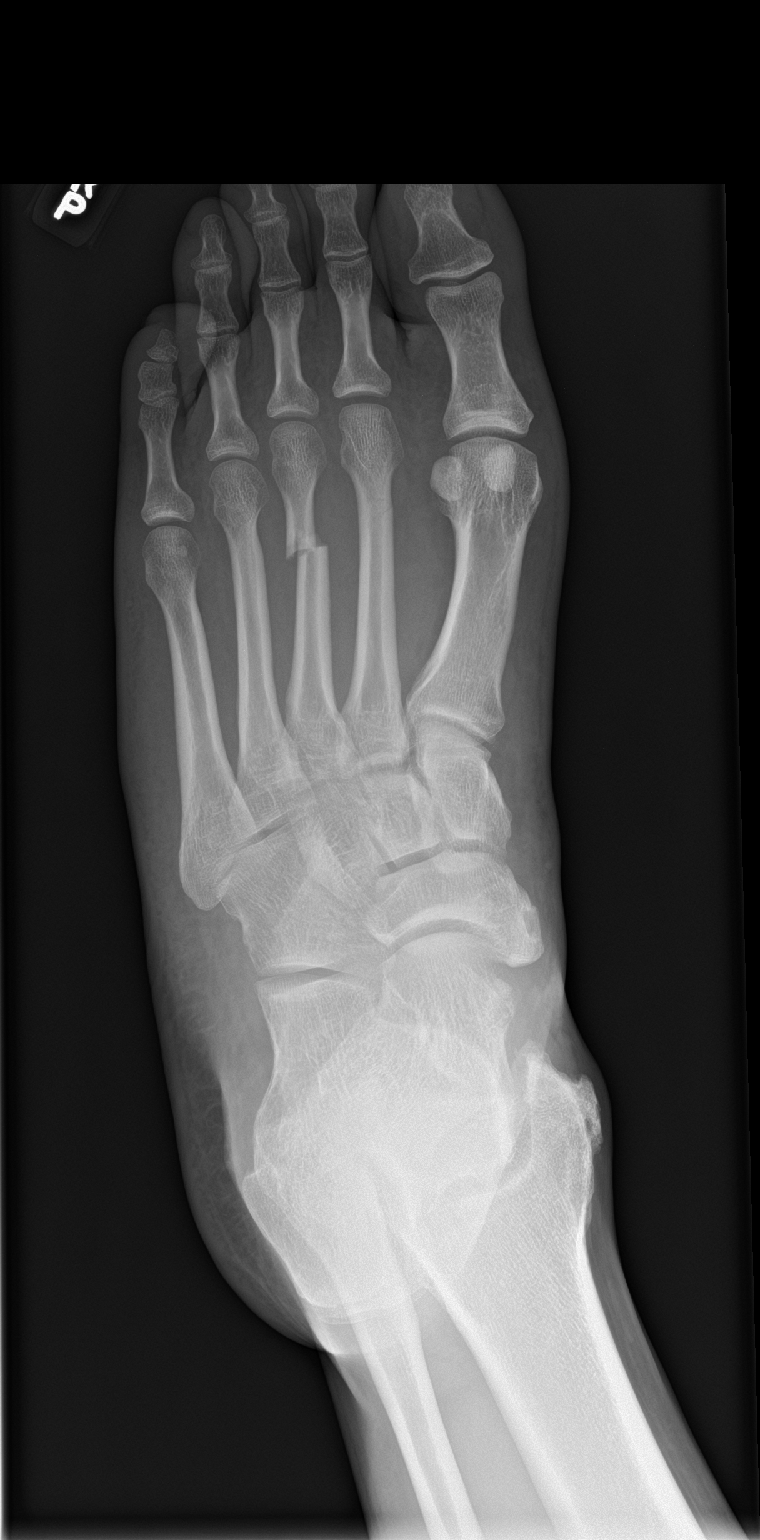

[foot obl]
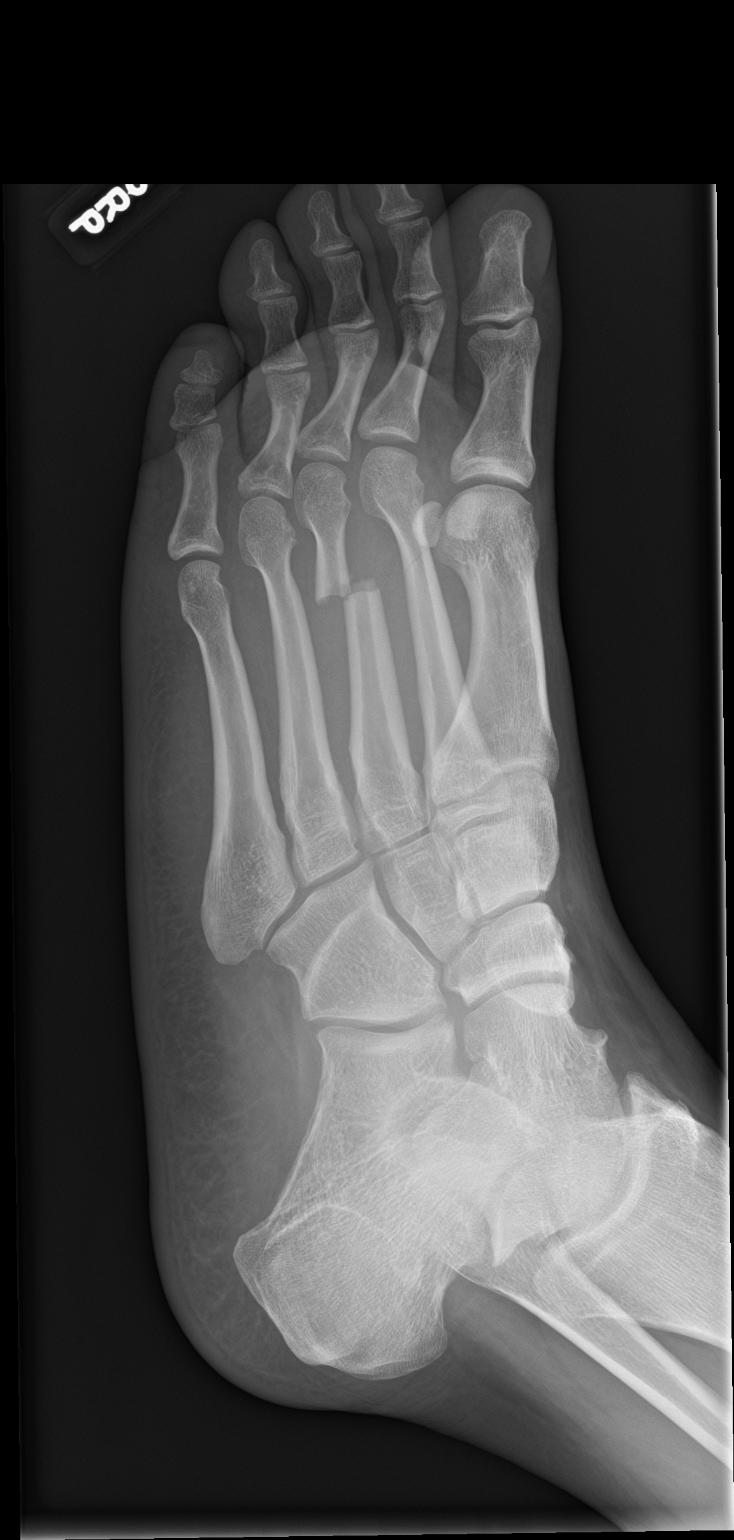

[foot lat]
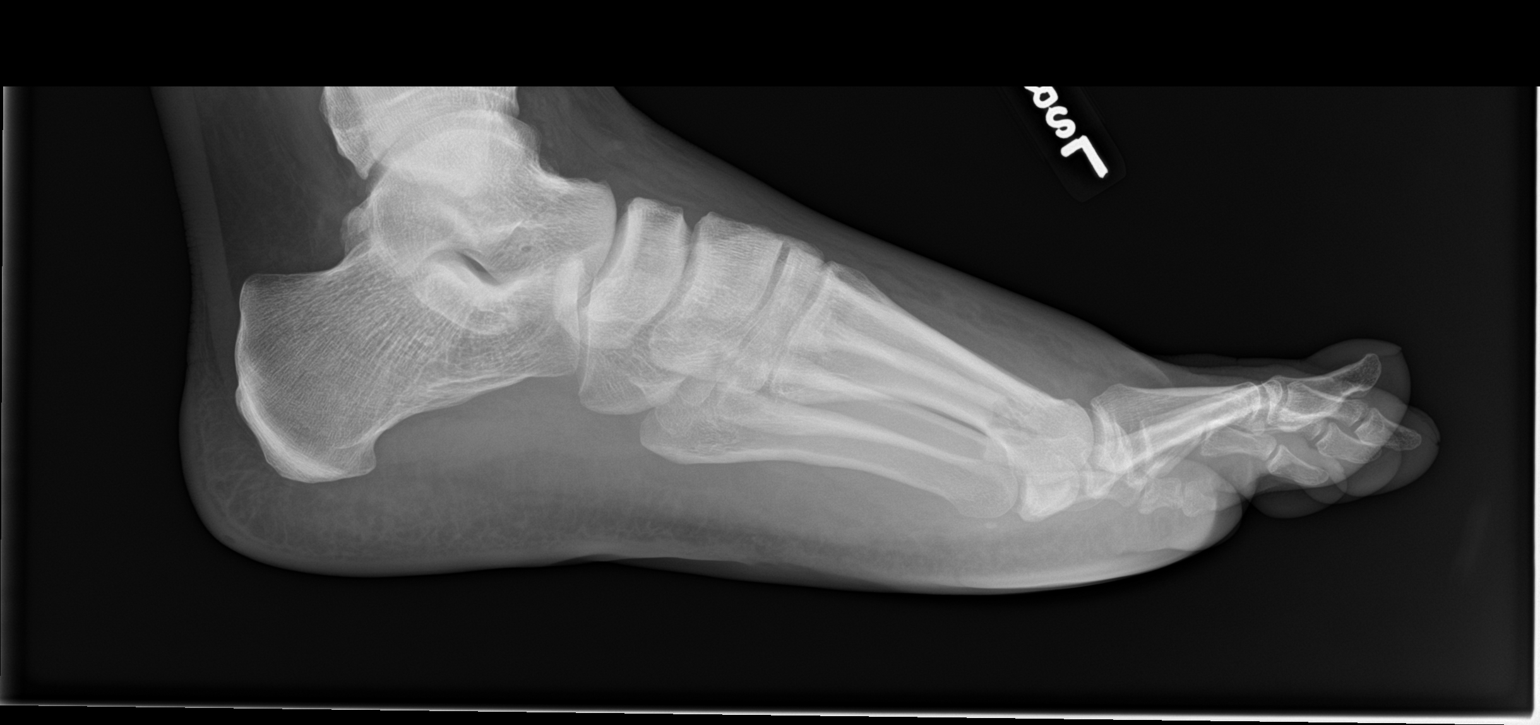

[3 of 3 positions shown; findings below may reference images not displayed]

FINDINGS: There is a mildly displaced transverse fracture of the distal
metaphysis of the left third metatarsal. There is an additional
nondisplaced oblique fracture of the distal metaphysis of the left
second metatarsal. Joint spaces are well preserved. Soft tissue
edema about the forefoot.
IMPRESSION: 1. Mildly displaced transverse fracture of the distal metaphysis of
the left third metatarsal.

2. Nondisplaced oblique fracture of the distal metaphysis of the
left second metatarsal.

## 2023-12-14 ENCOUNTER — Other Ambulatory Visit: Payer: Self-pay

## 2023-12-14 ENCOUNTER — Emergency Department (HOSPITAL_COMMUNITY)
Admission: EM | Admit: 2023-12-14 | Discharge: 2023-12-14 | Discharge status: Against Medical Advice | Payer: PRIVATE HEALTH INSURANCE | Attending: Emergency Medicine | Admitting: Emergency Medicine

## 2023-12-14 ENCOUNTER — Encounter (HOSPITAL_COMMUNITY): Payer: Self-pay

## 2023-12-14 DIAGNOSIS — Z5321 Procedure and treatment not carried out due to patient leaving prior to being seen by health care provider: Secondary | ICD-10-CM | POA: Diagnosis not present

## 2023-12-14 DIAGNOSIS — R462 Strange and inexplicable behavior: Secondary | ICD-10-CM | POA: Diagnosis present

## 2023-12-14 NOTE — ED Triage Notes (Addendum)
 Patient called 911. Patient brought in by EMS after being found on the street. Patient had strange behavior. Admits to doing cocaine and drinking alcohol.   Patient states he is looking for his dog. No medical complaints.

## 2023-12-14 NOTE — ED Notes (Addendum)
Pt called x3 for room, no answer.
